# Patient Record
Sex: Female | Born: 2005 | Race: White | Hispanic: Yes | Marital: Single | Smoking: Never smoker
Health system: Southern US, Community
[De-identification: ages and names within clinical notes are randomized; demographics above are authoritative.]

---
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
## 2023-07-01 ENCOUNTER — Emergency Department (HOSPITAL_BASED_OUTPATIENT_CLINIC_OR_DEPARTMENT_OTHER)
Admission: EM | Admit: 2023-07-01 | Discharge: 2023-07-01 | Disposition: A | Discharge status: Home / Self Care | Attending: Emergency Medicine | Admitting: Emergency Medicine

## 2023-07-01 ENCOUNTER — Other Ambulatory Visit: Payer: Self-pay

## 2023-07-01 ENCOUNTER — Emergency Department (HOSPITAL_BASED_OUTPATIENT_CLINIC_OR_DEPARTMENT_OTHER)

## 2023-07-01 ENCOUNTER — Encounter (HOSPITAL_BASED_OUTPATIENT_CLINIC_OR_DEPARTMENT_OTHER): Payer: Self-pay | Admitting: Emergency Medicine

## 2023-07-01 DIAGNOSIS — Y92410 Unspecified street and highway as the place of occurrence of the external cause: Secondary | ICD-10-CM | POA: Diagnosis not present

## 2023-07-01 DIAGNOSIS — M25531 Pain in right wrist: Secondary | ICD-10-CM | POA: Insufficient documentation
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
## 2023-07-01 NOTE — Discharge Instructions (Signed)
 It as a pleasure taking care of you here in the emergency department.  As we discussed in the room the x-ray of your wrist did not show any broken bones however you did have pain at your scaphoid bone.  We have placed you in a splint.  Follow-up with hand surgery if you are still having pain within the week to your wrist.  You may take the Velcro wrist splint off for bathing and other activities that you may need however will try to wear this is much as possible to help with the pain  Is typical to have pain worse on days 2 and day 3.,  Tylenol and motrin for pain.  Do not take Motrin if you think you may be pregnant  Follow-up outpatient, return for any worsening symptoms
